# Patient Record
Sex: Female | Born: 1995 | Race: Black or African American | Hispanic: No | Marital: Single | Smoking: Never smoker
Health system: Southern US, Community
[De-identification: ages and names within clinical notes are randomized; demographics above are authoritative.]

## PROBLEM LIST (undated history)

## (undated) DIAGNOSIS — R569 Unspecified convulsions: Secondary | ICD-10-CM

## (undated) DIAGNOSIS — F445 Conversion disorder with seizures or convulsions: Secondary | ICD-10-CM

## (undated) DIAGNOSIS — Z9289 Personal history of other medical treatment: Secondary | ICD-10-CM

---
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
## 2016-02-16 ENCOUNTER — Encounter (HOSPITAL_COMMUNITY): Payer: Self-pay

## 2016-02-16 ENCOUNTER — Emergency Department (HOSPITAL_COMMUNITY)
Admission: EM | Admit: 2016-02-16 | Discharge: 2016-02-16 | Disposition: A | Discharge status: Home / Self Care | Payer: No Typology Code available for payment source | Attending: Emergency Medicine | Admitting: Emergency Medicine

## 2016-02-16 DIAGNOSIS — Z9114 Patient's other noncompliance with medication regimen: Secondary | ICD-10-CM | POA: Insufficient documentation

## 2016-02-16 DIAGNOSIS — R569 Unspecified convulsions: Secondary | ICD-10-CM | POA: Insufficient documentation

## 2016-02-16 HISTORY — DX: Unspecified convulsions: R56.9

## 2016-02-16 LAB — I-STAT CHEM 8, ED
BUN: 15 mg/dL (ref 6–20)
CALCIUM ION: 1.21 mmol/L (ref 1.15–1.40)
Chloride: 105 mmol/L (ref 101–111)
Creatinine, Ser: 0.8 mg/dL (ref 0.44–1.00)
GLUCOSE: 80 mg/dL (ref 65–99)
HCT: 30 % — ABNORMAL LOW (ref 36.0–46.0)
HEMOGLOBIN: 10.2 g/dL — AB (ref 12.0–15.0)
Potassium: 3.9 mmol/L (ref 3.5–5.1)
Sodium: 141 mmol/L (ref 135–145)
TCO2: 23 mmol/L (ref 0–100)

## 2016-02-16 LAB — I-STAT BETA HCG BLOOD, ED (MC, WL, AP ONLY)

## 2016-02-16 LAB — CBG MONITORING, ED: GLUCOSE-CAPILLARY: 89 mg/dL (ref 65–99)

## 2016-02-16 MED ORDER — SODIUM CHLORIDE 0.9 % IV BOLUS (SEPSIS)
1000.0000 mL | Freq: Once | INTRAVENOUS | Status: AC
  Administered 2016-02-16: 1000 mL via INTRAVENOUS

## 2016-02-16 NOTE — ED Provider Notes (Signed)
[facility] DEPT Provider Note   CSN: [id number] Arrival date & time: 02/16/16  1022     History   Chief Complaint Chief Complaint  Patient presents with  . Seizures    seizures, pt had multiple seizures en route has a hx of seizures     HPI Elizabeth Wilkinson is a 20 y.o. female.  The history is provided by the patient.  Seizures   This is a chronic problem. The current episode started 1 to 2 hours ago. The problem has been resolved. There were 6 to 10 seizures. The most recent episode lasted 30 to 120 seconds. Associated symptoms include confusion. Pertinent negatives include no sleepiness, no headaches, no visual disturbance, no neck stiffness, no sore throat, no chest pain, no cough, no nausea, no vomiting and no diarrhea. Characteristics include rhythmic jerking and loss of consciousness. The episode was witnessed. The seizures did not continue in the ED. There has been no fever. Meds prior to arrival: 2.5 Versed by EMS.   Patient does not remember what antiepileptic medicine she takes but does report being noncompliant. She reports she forgets sometimes. Last time she remembers taking a medicine was "maybe a week ago."  She denies any recent infectious symptoms. Denies any recent sexual relations and does not believe she is pregnant. Denies any recent trauma.   Past Medical History:  Diagnosis Date  . Seizure (HCC)     There are no active problems to display for this patient.   History reviewed. No pertinent surgical history.  OB History    No data available       Home Medications    Prior to Admission medications   Not on File    Family History No family history on file.  Social History Social History  Substance Use Topics  . Smoking status: Never Smoker  . Smokeless tobacco: Never Used  . Alcohol use No     Allergies   Review of patient's allergies indicates no known allergies.   Review of Systems Review of Systems  Constitutional: Negative  for chills and fever.  HENT: Negative for ear pain and sore throat.   Eyes: Negative for pain and visual disturbance.  Respiratory: Negative for cough and shortness of breath.   Cardiovascular: Negative for chest pain and palpitations.  Gastrointestinal: Negative for abdominal pain, diarrhea, nausea and vomiting.  Genitourinary: Negative for dysuria and hematuria.  Musculoskeletal: Negative for arthralgias and back pain.  Skin: Negative for color change and rash.  Neurological: Positive for seizures and loss of consciousness. Negative for syncope and headaches.  Psychiatric/Behavioral: Positive for confusion.  All other systems reviewed and are negative.    Physical Exam Updated Vital Signs BP 109/72 (BP Location: Left Arm)   Pulse 89   Temp 98.2 F (36.8 C) (Oral)   Resp 18   Ht 5\' 6"  (1.676 m)   Wt 127 lb (57.6 kg)   SpO2 100%   BMI 20.50 kg/m   Physical Exam  Constitutional: She is oriented to person, place, and time. She appears well-developed and well-nourished. No distress.  HENT:  Head: Normocephalic and atraumatic.  Nose: Nose normal.  Eyes: Conjunctivae and EOM are normal. Pupils are equal, round, and reactive to light. Right eye exhibits no discharge. Left eye exhibits no discharge. No scleral icterus.  Neck: Normal range of motion. Neck supple.  Cardiovascular: Normal rate and regular rhythm.  Exam reveals no gallop and no friction rub.   No murmur heard. Pulmonary/Chest: Effort normal and breath  sounds normal. No stridor. No respiratory distress. She has no rales.  Abdominal: Soft. She exhibits no distension. There is no tenderness.  Musculoskeletal: She exhibits no edema or tenderness.  Neurological: She is alert and oriented to person, place, and time.  Skin: Skin is warm and dry. No rash noted. She is not diaphoretic. No erythema.  Psychiatric: She has a normal mood and affect.  Vitals reviewed.    ED Treatments / Results  Labs (all labs ordered are  listed, but only abnormal results are displayed) Labs Reviewed  I-STAT CHEM 8, ED - Abnormal; Notable for the following:       Result Value   Hemoglobin 10.2 (*)    HCT 30.0 (*)    All other components within normal limits  CBG MONITORING, ED  I-STAT BETA HCG BLOOD, ED ([facility], [facility], [facility] ONLY)    EKG  EKG Interpretation None       Radiology No results found.  Procedures Procedures (including critical care time)  Medications Ordered in ED Medications  sodium chloride 0.9 % bolus 1,000 mL (1,000 mLs Intravenous New Bag/Given 02/16/16 1049)     Initial Impression / Assessment and Plan / ED Course  I have reviewed the triage vital signs and the nursing notes.  Pertinent labs & imaging results that were available during my care of the patient were reviewed by me and considered in my medical decision making (see chart for details).  Clinical Course    Patient with a history of seizures was been noncompliant with her medication and presented today with multiple seizure episodes requiring 2.5 mg of Versed by EMS. No recent triggers. We'll assess basic labs and check urine pregnancy test. On my assessment patient is awake alert oriented 3 and is mildly confused.  We'll contact the patient's family with her permission to determine what kind of antiepileptic patient is on.  Patient given IV fluids.  Upon review of records on care everywhere, we noted that the patient has a history of nonepileptic seizures who was placed on a trial of Keppra prior to obtaining an EEG that was nondiagnostic for epilepsy. Since then patient has been off Keppra. We spoke with mom as well who confirmed.  We'll follow up on screening labs and pregnancy test. If these are unremarkable the patient would be safe for discharge with PCP follow-up.  Labs without any electrolyte derangements. Beta hCG negative.  Safe for discharge with strict return precautions.  Final Clinical Impressions(s) / ED Diagnoses    Final diagnoses:  Seizure-like activity (HCC)   Disposition: Discharge  Condition: Good  I have discussed the results, Dx and Tx plan with the patient who expressed understanding and agree(s) with the plan. Discharge instructions discussed at great length. The patient was given strict return precautions who verbalized understanding of the instructions. No further questions at time of discharge.    New Prescriptions   No medications on file    Follow Up: Primary care provider  Schedule an appointment as soon as possible for a visit  As needed      Nira ConnPedro Eduardo Cardama, MD 02/16/16 1245

## 2016-02-16 NOTE — ED Notes (Signed)
MD at bedside. Pt able to recall events though pt does appear drowsy

## 2016-02-16 NOTE — ED Notes (Addendum)
Spoke with mother via phone, as per mother pt does not take any medications for seizures. Pt is currently speaking with mother and appears awake and alert IVF infusing pt's mother stated that they did a trial of seizure medications but that they did not do anything.
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
## 2017-02-13 ENCOUNTER — Emergency Department (HOSPITAL_COMMUNITY): Payer: Self-pay

## 2017-02-13 ENCOUNTER — Emergency Department (HOSPITAL_COMMUNITY)
Admission: EM | Admit: 2017-02-13 | Discharge: 2017-02-14 | Disposition: A | Discharge status: Home / Self Care | Payer: Self-pay | Attending: Emergency Medicine | Admitting: Emergency Medicine

## 2017-02-13 DIAGNOSIS — R079 Chest pain, unspecified: Secondary | ICD-10-CM | POA: Insufficient documentation

## 2017-02-13 DIAGNOSIS — R569 Unspecified convulsions: Secondary | ICD-10-CM | POA: Insufficient documentation

## 2017-02-13 DIAGNOSIS — M25561 Pain in right knee: Secondary | ICD-10-CM | POA: Insufficient documentation

## 2017-02-13 DIAGNOSIS — Z3490 Encounter for supervision of normal pregnancy, unspecified, unspecified trimester: Secondary | ICD-10-CM | POA: Insufficient documentation

## 2017-02-13 LAB — CBC WITH DIFFERENTIAL/PLATELET
Basophils Absolute: 0 10*3/uL (ref 0.0–0.1)
Basophils Relative: 0 %
EOS ABS: 0 10*3/uL (ref 0.0–0.7)
Eosinophils Relative: 1 %
HCT: 28.5 % — ABNORMAL LOW (ref 36.0–46.0)
Hemoglobin: 9.1 g/dL — ABNORMAL LOW (ref 12.0–15.0)
LYMPHS ABS: 2.7 10*3/uL (ref 0.7–4.0)
LYMPHS PCT: 32 %
MCH: 24.2 pg — AB (ref 26.0–34.0)
MCHC: 31.9 g/dL (ref 30.0–36.0)
MCV: 75.8 fL — AB (ref 78.0–100.0)
MONO ABS: 0.4 10*3/uL (ref 0.1–1.0)
Monocytes Relative: 5 %
Neutro Abs: 5.4 10*3/uL (ref 1.7–7.7)
Neutrophils Relative %: 63 %
Platelets: 255 10*3/uL (ref 150–400)
RBC: 3.76 MIL/uL — AB (ref 3.87–5.11)
RDW: 16 % — AB (ref 11.5–15.5)
WBC: 8.6 10*3/uL (ref 4.0–10.5)

## 2017-02-13 LAB — I-STAT BETA HCG BLOOD, ED (MC, WL, AP ONLY)

## 2017-02-13 LAB — COMPREHENSIVE METABOLIC PANEL
ALBUMIN: 3.7 g/dL (ref 3.5–5.0)
ALK PHOS: 48 U/L (ref 38–126)
ALT: 11 U/L — ABNORMAL LOW (ref 14–54)
ANION GAP: 9 (ref 5–15)
AST: 20 U/L (ref 15–41)
BILIRUBIN TOTAL: 0.4 mg/dL (ref 0.3–1.2)
BUN: 18 mg/dL (ref 6–20)
CALCIUM: 8.9 mg/dL (ref 8.9–10.3)
CO2: 21 mmol/L — ABNORMAL LOW (ref 22–32)
Chloride: 106 mmol/L (ref 101–111)
Creatinine, Ser: 0.78 mg/dL (ref 0.44–1.00)
GFR calc Af Amer: >60 mL/min (ref >60)
GLUCOSE: 89 mg/dL (ref 65–99)
POTASSIUM: 3.6 mmol/L (ref 3.5–5.1)
Sodium: 136 mmol/L (ref 135–145)
TOTAL PROTEIN: 6.5 g/dL (ref 6.5–8.1)

## 2017-02-13 LAB — I-STAT TROPONIN, ED: TROPONIN I, POC: 0 ng/mL (ref 0.00–0.08)

## 2017-02-13 LAB — ETHANOL: Alcohol, Ethyl (B): <10 mg/dL (ref <10)

## 2017-02-13 LAB — HCG, QUANTITATIVE, PREGNANCY: hCG, Beta Chain, Quant, S: 2413 m[IU]/mL — ABNORMAL HIGH (ref <5)

## 2017-02-13 MED ORDER — ACETAMINOPHEN 325 MG PO TABS
650.0000 mg | ORAL_TABLET | Freq: Once | ORAL | Status: AC
  Administered 2017-02-13: 650 mg via ORAL
  Filled 2017-02-13: qty 2

## 2017-02-13 MED ORDER — PRENATAL COMPLETE 14-0.4 MG PO TABS: 1.0000 | ORAL_TABLET | Freq: Every day | ORAL | 0 refills | Status: AC

## 2017-02-13 MED ORDER — SODIUM CHLORIDE 0.9 % IV BOLUS (SEPSIS)
1000.0000 mL | Freq: Once | INTRAVENOUS | Status: AC
  Administered 2017-02-13: 1000 mL via INTRAVENOUS

## 2017-02-13 MED ORDER — FENTANYL CITRATE (PF) 100 MCG/2ML IJ SOLN
25.0000 ug | Freq: Once | INTRAMUSCULAR | Status: DC
Start: 2017-02-13 — End: 2017-02-13
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
## 2017-02-13 NOTE — Discharge Instructions (Signed)
Take prenatal vitamins.   [facility]   Take tylenol for pain.   Stay hydrated.   Return to ER if you have vaginal bleeding, seizure, lethargy, chest pain, abdominal pain.
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
## 2017-02-13 NOTE — ED Notes (Signed)
Pt requesting for ED providers to ask family members to get out of the room prior to give any information about her care.

## 2017-02-14 ENCOUNTER — Emergency Department (HOSPITAL_COMMUNITY)
Admission: EM | Admit: 2017-02-14 | Discharge: 2017-02-15 | Disposition: A | Discharge status: Home / Self Care | Payer: Medicaid Other | Attending: Emergency Medicine | Admitting: Emergency Medicine

## 2017-02-14 ENCOUNTER — Encounter (HOSPITAL_COMMUNITY): Payer: Self-pay | Admitting: Emergency Medicine

## 2017-02-14 DIAGNOSIS — O200 Threatened abortion: Secondary | ICD-10-CM

## 2017-02-14 DIAGNOSIS — O4690 Antepartum hemorrhage, unspecified, unspecified trimester: Secondary | ICD-10-CM

## 2017-02-14 DIAGNOSIS — Z3A01 Less than 8 weeks gestation of pregnancy: Secondary | ICD-10-CM | POA: Insufficient documentation

## 2017-02-14 LAB — URINALYSIS, ROUTINE W REFLEX MICROSCOPIC
Bacteria, UA: NONE SEEN
Bilirubin Urine: NEGATIVE
Glucose, UA: NEGATIVE mg/dL
Ketones, ur: NEGATIVE mg/dL
LEUKOCYTES UA: NEGATIVE
NITRITE: NEGATIVE
Protein, ur: NEGATIVE mg/dL
SPECIFIC GRAVITY, URINE: 1.028 (ref 1.005–1.030)
pH: 5 (ref 5.0–8.0)

## 2017-02-14 LAB — CBC WITH DIFFERENTIAL/PLATELET
BASOS PCT: 0 %
Basophils Absolute: 0 10*3/uL (ref 0.0–0.1)
EOS ABS: 0.1 10*3/uL (ref 0.0–0.7)
EOS PCT: 1 %
HCT: 29.4 % — ABNORMAL LOW (ref 36.0–46.0)
Hemoglobin: 8.8 g/dL — ABNORMAL LOW (ref 12.0–15.0)
LYMPHS ABS: 2.7 10*3/uL (ref 0.7–4.0)
Lymphocytes Relative: 36 %
MCH: 23.3 pg — ABNORMAL LOW (ref 26.0–34.0)
MCHC: 29.9 g/dL — ABNORMAL LOW (ref 30.0–36.0)
MCV: 77.8 fL — ABNORMAL LOW (ref 78.0–100.0)
Monocytes Absolute: 0.3 10*3/uL (ref 0.1–1.0)
Monocytes Relative: 4 %
Neutro Abs: 4.5 10*3/uL (ref 1.7–7.7)
Neutrophils Relative %: 59 %
PLATELETS: 256 10*3/uL (ref 150–400)
RBC: 3.78 MIL/uL — AB (ref 3.87–5.11)
RDW: 15.8 % — ABNORMAL HIGH (ref 11.5–15.5)
WBC: 7.6 10*3/uL (ref 4.0–10.5)

## 2017-02-14 LAB — COMPREHENSIVE METABOLIC PANEL
ALT: 10 U/L — ABNORMAL LOW (ref 14–54)
ANION GAP: 5 (ref 5–15)
AST: 18 U/L (ref 15–41)
Albumin: 3.7 g/dL (ref 3.5–5.0)
Alkaline Phosphatase: 45 U/L (ref 38–126)
BILIRUBIN TOTAL: 0.4 mg/dL (ref 0.3–1.2)
BUN: 12 mg/dL (ref 6–20)
CO2: 24 mmol/L (ref 22–32)
Calcium: 9 mg/dL (ref 8.9–10.3)
Chloride: 108 mmol/L (ref 101–111)
Creatinine, Ser: 0.87 mg/dL (ref 0.44–1.00)
GFR calc Af Amer: >60 mL/min (ref >60)
Glucose, Bld: 122 mg/dL — ABNORMAL HIGH (ref 65–99)
POTASSIUM: 3.5 mmol/L (ref 3.5–5.1)
Sodium: 137 mmol/L (ref 135–145)
TOTAL PROTEIN: 6.4 g/dL — AB (ref 6.5–8.1)

## 2017-02-14 LAB — HCG, QUANTITATIVE, PREGNANCY: HCG, BETA CHAIN, QUANT, S: 2769 m[IU]/mL — AB (ref <5)
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
## 2017-02-15 LAB — WET PREP, GENITAL
CLUE CELLS WET PREP: NONE SEEN
Sperm: NONE SEEN
TRICH WET PREP: NONE SEEN
WBC, Wet Prep HPF POC: NONE SEEN
Yeast Wet Prep HPF POC: NONE SEEN

## 2017-02-15 LAB — GC/CHLAMYDIA PROBE AMP (~~LOC~~) NOT AT ARMC
Chlamydia: NEGATIVE
NEISSERIA GONORRHEA: NEGATIVE

## 2017-02-15 MED ORDER — ACETAMINOPHEN 500 MG PO TABS
1000.0000 mg | ORAL_TABLET | Freq: Once | ORAL | Status: AC
  Administered 2017-02-15: 1000 mg via ORAL
  Filled 2017-02-15: qty 2

## 2017-02-15 NOTE — ED Notes (Signed)
Pelvic completed spec to lab
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
## 2017-02-15 NOTE — Discharge Instructions (Signed)
You may take Tylenol 1000 mg every 6 hours as needed for pain. Ibuprofen, aspirin, Aleve, Goody powders are not safe in pregnancy. I recommend close follow-up with an OB/GYN to have your hCG rechecked in 48 hours.  If you begin to have worsening pain, fever of 100.4 higher, increased vaginal bleeding where your soaking through more than 1 pad an hour for more than 2 straight hours, feel like he may pass out, please return to the hospital.    [email or web address] [address] [place], [place] [phone]    [email or web address] [address] [place], [place] [phone]    [facility] [address] [place], [place] [phone]   [email or web address] [address] [place], [place] [phone]   [facility] [email or web address] [address] [place], [place] [phone]   [email or web address] [address] [place], [place] [phone]

## 2017-02-20 ENCOUNTER — Encounter: Payer: Self-pay | Admitting: Family Medicine

## 2017-02-20 ENCOUNTER — Ambulatory Visit: Payer: Self-pay | Admitting: *Deleted

## 2017-02-20 DIAGNOSIS — O209 Hemorrhage in early pregnancy, unspecified: Secondary | ICD-10-CM

## 2017-02-20 DIAGNOSIS — Z3490 Encounter for supervision of normal pregnancy, unspecified, unspecified trimester: Secondary | ICD-10-CM

## 2017-02-20 NOTE — Progress Notes (Signed)
Elizabeth Wilkinson here for stat bhcg but upon review did have an ultrasound confirming intrauterine pregancy on 02/13/17.  Has had bleeding like a period since 02/14/17 and pain got worse while in [place] on 9/281/8 and went to ed then.  Was told is having a miscarriage. States bleeding like a period until 02/17/17 then light bleeding until today spotting. Reviewed with Dr. Shawnie Pons and ordered US in 2 weeks from last ultrasound. Explained to patient plan of care and to go to [facility]au for severe pain or heavy bleeding. She voices understanding.

## 2017-02-20 NOTE — Progress Notes (Signed)
Patient seen and assessed by nursing staff.  Agree with documentation and plan.  

## 2017-02-27 ENCOUNTER — Ambulatory Visit: Payer: Self-pay | Admitting: General Practice

## 2017-02-27 ENCOUNTER — Ambulatory Visit (HOSPITAL_COMMUNITY)
Admission: RE | Admit: 2017-02-27 | Discharge: 2017-02-27 | Disposition: A | Discharge status: Home / Self Care | Payer: Medicaid Other | Source: Ambulatory Visit | Attending: Family Medicine | Admitting: Family Medicine

## 2017-02-27 DIAGNOSIS — Z3490 Encounter for supervision of normal pregnancy, unspecified, unspecified trimester: Secondary | ICD-10-CM

## 2017-02-27 DIAGNOSIS — O209 Hemorrhage in early pregnancy, unspecified: Secondary | ICD-10-CM

## 2017-02-27 DIAGNOSIS — O039 Complete or unspecified spontaneous abortion without complication: Secondary | ICD-10-CM

## 2017-02-27 NOTE — Addendum Note (Signed)
Addended by: Kathee Delton on: 02/27/2017 04:36 PM   Modules accepted: Level of Service

## 2017-02-27 NOTE — Progress Notes (Signed)
Agree with nursing staff's documentation of this patient's clinic encounter.  Karita Dralle, MD    

## 2017-02-27 NOTE — Progress Notes (Signed)
Patient here for ultrasound results today. Reviewed ultrasound/history with Dr Macon Large who states ultrasound consistent with failed pregnancy but should repeat bhcg today and follow to 0. Informed patient of results & plan of care. Patient verbalized understanding and had no questions

## 2017-02-28 LAB — BETA HCG QUANT (REF LAB): HCG QUANT: 10 m[IU]/mL

## 2017-05-11 ENCOUNTER — Other Ambulatory Visit: Payer: Self-pay

## 2017-05-11 ENCOUNTER — Emergency Department (HOSPITAL_COMMUNITY)
Admission: EM | Admit: 2017-05-11 | Discharge: 2017-05-11 | Disposition: A | Discharge status: Home / Self Care | Payer: Medicaid Other | Attending: Emergency Medicine | Admitting: Emergency Medicine

## 2017-05-11 ENCOUNTER — Encounter (HOSPITAL_COMMUNITY): Payer: Self-pay

## 2017-05-11 DIAGNOSIS — F445 Conversion disorder with seizures or convulsions: Secondary | ICD-10-CM | POA: Insufficient documentation

## 2017-05-11 DIAGNOSIS — R4020 Unspecified coma: Secondary | ICD-10-CM

## 2017-05-11 DIAGNOSIS — R569 Unspecified convulsions: Secondary | ICD-10-CM

## 2017-05-11 DIAGNOSIS — R55 Syncope and collapse: Secondary | ICD-10-CM | POA: Insufficient documentation

## 2017-05-11 HISTORY — DX: Conversion disorder with seizures or convulsions: F44.5

## 2017-05-11 HISTORY — DX: Personal history of other medical treatment: Z92.89

## 2017-05-11 LAB — CBC WITH DIFFERENTIAL/PLATELET
BASOS ABS: 0 10*3/uL (ref 0.0–0.1)
BASOS PCT: 0 %
EOS ABS: 0 10*3/uL (ref 0.0–0.7)
EOS PCT: 0 %
HCT: 28.4 % — ABNORMAL LOW (ref 36.0–46.0)
HEMOGLOBIN: 8.9 g/dL — AB (ref 12.0–15.0)
Lymphocytes Relative: 26 %
Lymphs Abs: 2 10*3/uL (ref 0.7–4.0)
MCH: 24.2 pg — ABNORMAL LOW (ref 26.0–34.0)
MCHC: 31.3 g/dL (ref 30.0–36.0)
MCV: 77.2 fL — ABNORMAL LOW (ref 78.0–100.0)
Monocytes Absolute: 0.3 10*3/uL (ref 0.1–1.0)
Monocytes Relative: 4 %
NEUTROS PCT: 70 %
Neutro Abs: 5.3 10*3/uL (ref 1.7–7.7)
PLATELETS: 244 10*3/uL (ref 150–400)
RBC: 3.68 MIL/uL — AB (ref 3.87–5.11)
RDW: 17.5 % — ABNORMAL HIGH (ref 11.5–15.5)
WBC: 7.6 10*3/uL (ref 4.0–10.5)

## 2017-05-11 LAB — I-STAT BETA HCG BLOOD, ED (MC, WL, AP ONLY)

## 2017-05-11 LAB — COMPREHENSIVE METABOLIC PANEL
ALT: 9 U/L — AB (ref 14–54)
AST: 20 U/L (ref 15–41)
Albumin: 3.7 g/dL (ref 3.5–5.0)
Alkaline Phosphatase: 47 U/L (ref 38–126)
Anion gap: 6 (ref 5–15)
BILIRUBIN TOTAL: 0.6 mg/dL (ref 0.3–1.2)
BUN: 16 mg/dL (ref 6–20)
CO2: 26 mmol/L (ref 22–32)
CREATININE: 0.79 mg/dL (ref 0.44–1.00)
Calcium: 8.9 mg/dL (ref 8.9–10.3)
Chloride: 105 mmol/L (ref 101–111)
Glucose, Bld: 129 mg/dL — ABNORMAL HIGH (ref 65–99)
Potassium: 3.4 mmol/L — ABNORMAL LOW (ref 3.5–5.1)
Sodium: 137 mmol/L (ref 135–145)
TOTAL PROTEIN: 6.7 g/dL (ref 6.5–8.1)

## 2017-05-11 LAB — CBG MONITORING, ED: Glucose-Capillary: 129 mg/dL — ABNORMAL HIGH (ref 65–99)

## 2017-05-11 MED ORDER — SODIUM CHLORIDE 0.9 % IV BOLUS (SEPSIS)
1000.0000 mL | Freq: Once | INTRAVENOUS | Status: AC
  Administered 2017-05-11: 1000 mL via INTRAVENOUS

## 2017-05-11 MED ORDER — POTASSIUM CHLORIDE CRYS ER 20 MEQ PO TBCR
40.0000 meq | EXTENDED_RELEASE_TABLET | Freq: Once | ORAL | Status: AC
  Administered 2017-05-11: 40 meq via ORAL
  Filled 2017-05-11: qty 2

## 2017-05-11 NOTE — ED Provider Notes (Signed)
[facility]-EMERGENCY DEPT Provider Note   CSN: [id number] Arrival date & time: 05/11/17  1246     History   Chief Complaint No chief complaint on file.   HPI Elizabeth Wilkinson is a 21 y.o. female who presents with loss of consciousness. PMH significant for pseudoseizures and anemia. She states she was at work - she works a Production assistant, radioserver at [employer] - when she suddenly passed out and had a "seizure". This was witnessed by coworkers who reported she had rhythmic jerking. The episode lasted 10-30 seconds. She does not recall the events surrounding the loss of consciousness. She denies any pain currently or injuries. She denies any fever, chills. She does have chest pain occassionally but not today. She had a cold last week but this is better. She has previously taken Keppra but was taken off of this since her seizures were non-epileptic. She states she is not currently pregnant and had a miscarriage a couple months ago.  HPI  Past Medical History:  Diagnosis Date  . History of electroencephalogram 2014   negative  . Psychogenic nonepileptic seizure 2014   [facility] (admission with video EEG)  . Seizure (HCC)     There are no active problems to display for this patient.   History reviewed. No pertinent surgical history.  OB History    Gravida Para Term Preterm AB Living   1             SAB TAB Ectopic Multiple Live Births                   Home Medications    Prior to Admission medications   Medication Sig Start Date End Date Taking? Authorizing Provider  albuterol (PROVENTIL HFA;VENTOLIN HFA) 108 (90 Base) MCG/ACT inhaler Inhale 1-2 puffs into the lungs every 6 (six) hours as needed for wheezing or shortness of breath.    [provider]  Prenatal Vit-Fe Fumarate-FA (PRENATAL COMPLETE) 14-0.4 MG TABS Take 1 tablet by mouth daily. Patient not taking: Reported on 02/14/2017 02/13/17   Charlynne PanderYao, David Hsienta, MD    Family History No family history  on file.  Social History Social History   Tobacco Use  . Smoking status: Never Smoker  . Smokeless tobacco: Never Used  Substance Use Topics  . Alcohol use: No  . Drug use: No     Allergies   Patient has no known allergies.   Review of Systems Review of Systems  Constitutional: Negative for chills and fever.  Respiratory: Negative for shortness of breath.   Cardiovascular: Positive for chest pain.  Gastrointestinal: Negative for abdominal pain, nausea and vomiting.  Genitourinary: Negative for dysuria.  Skin: Negative for wound.  Neurological: Positive for seizures and syncope. Negative for weakness, light-headedness and headaches.  Psychiatric/Behavioral: The patient is nervous/anxious.   All other systems reviewed and are negative.    Physical Exam Updated Vital Signs BP 110/64 (BP Location: Right Arm)   Pulse 91   Temp 98.6 F (37 C) (Oral)   Resp 18   Physical Exam  Constitutional: She is oriented to person, place, and time. She appears well-developed and well-nourished. No distress.  Alert and oriented, in NAD  HENT:  Head: Normocephalic and atraumatic.  Eyes: Conjunctivae are normal. Pupils are equal, round, and reactive to light. Right eye exhibits no discharge. Left eye exhibits no discharge. No scleral icterus.  Neck: Normal range of motion.  Cardiovascular: Normal rate and regular rhythm. Exam reveals no gallop and  no friction rub.  No murmur heard. Pulmonary/Chest: Effort normal and breath sounds normal. No stridor. No respiratory distress. She has no wheezes. She has no rales. She exhibits no tenderness.  Abdominal: Soft. Bowel sounds are normal. She exhibits no distension. There is no tenderness.  Neurological: She is alert and oriented to person, place, and time.  Skin: Skin is warm and dry.  Psychiatric: She has a normal mood and affect. Her behavior is normal.  Nursing note and vitals reviewed.    ED Treatments / Results  Labs (all labs  ordered are listed, but only abnormal results are displayed) Labs Reviewed  COMPREHENSIVE METABOLIC PANEL - Abnormal; Notable for the following components:      Result Value   Potassium 3.4 (*)    Glucose, Bld 129 (*)    ALT 9 (*)    All other components within normal limits  CBC WITH DIFFERENTIAL/PLATELET - Abnormal; Notable for the following components:   RBC 3.68 (*)    Hemoglobin 8.9 (*)    HCT 28.4 (*)    MCV 77.2 (*)    MCH 24.2 (*)    RDW 17.5 (*)    All other components within normal limits  CBG MONITORING, ED - Abnormal; Notable for the following components:   Glucose-Capillary 129 (*)    All other components within normal limits  I-STAT BETA HCG BLOOD, ED ([facility], [facility], [facility] ONLY)    EKG  EKG Interpretation None       Radiology No results found.  Procedures Procedures (including critical care time)  Medications Ordered in ED Medications  sodium chloride 0.9 % bolus 1,000 mL (0 mLs Intravenous Stopped 05/11/17 1635)  potassium chloride SA (K-DUR,KLOR-CON) CR tablet 40 mEq (40 mEq Oral Given 05/11/17 1634)     Initial Impression / Assessment and Plan / ED Course  I have reviewed the triage vital signs and the nursing notes.  Pertinent labs & imaging results that were available during my care of the patient were reviewed by me and considered in my medical decision making (see chart for details).  21 year old with loss of consciousness. Vitals are normal. Exam is unremarkable. CBC is remarkable for hgb of 8.9 which is stable for prior values. She does endorse being on iron supplementation. CMP shows mild hypokalemia. She was given fluids and potassium. EKG is NSR. She has had an extensive work up for this problem in the past including head CT and EEG which were both normal. Will d/c and advised to establish care with a PCP.  Final Clinical Impressions(s) / ED Diagnoses   Final diagnoses:  Loss of consciousness [facility](HCC)  Pseudoseizures    ED Discharge Orders    None        Bethel BornGekas, Bryan Goin Marie, PA-C 05/11/17 1801    Rolland PorterJames, Mark, MD 05/12/17 [phone]

## 2017-05-11 NOTE — ED Notes (Signed)
Bed: WA21 Expected date: 05/11/17 Expected time: 12:35 PM Means of arrival: Ambulance Comments: Seizures

## 2017-05-11 NOTE — ED Notes (Addendum)
Pt has ambulated in hallway to restroom at this time. Pt was shaky and walked with 2 person assistance to and from restroom.

## 2017-05-11 NOTE — ED Triage Notes (Signed)
Patient coming in by ems with c/o seizures. Pt had witness seizures and per witness pt had about 15-20 seizures that last 10-30 second. Pt have hx seizures. Pt state she stop taking her medication since high school.

## 2017-07-31 ENCOUNTER — Encounter (HOSPITAL_COMMUNITY): Payer: Self-pay | Admitting: *Deleted

## 2017-07-31 ENCOUNTER — Other Ambulatory Visit: Payer: Self-pay

## 2017-07-31 ENCOUNTER — Emergency Department (HOSPITAL_COMMUNITY): Payer: Medicaid Other

## 2017-07-31 ENCOUNTER — Emergency Department (HOSPITAL_COMMUNITY)
Admission: EM | Admit: 2017-07-31 | Discharge: 2017-07-31 | Disposition: A | Discharge status: Home / Self Care | Payer: Medicaid Other | Attending: Emergency Medicine | Admitting: Emergency Medicine

## 2017-07-31 DIAGNOSIS — G43109 Migraine with aura, not intractable, without status migrainosus: Secondary | ICD-10-CM

## 2017-07-31 LAB — CBC
HCT: 30.4 % — ABNORMAL LOW (ref 36.0–46.0)
Hemoglobin: 9.1 g/dL — ABNORMAL LOW (ref 12.0–15.0)
MCH: 23 pg — AB (ref 26.0–34.0)
MCHC: 29.9 g/dL — ABNORMAL LOW (ref 30.0–36.0)
MCV: 76.8 fL — ABNORMAL LOW (ref 78.0–100.0)
PLATELETS: 253 10*3/uL (ref 150–400)
RBC: 3.96 MIL/uL (ref 3.87–5.11)
RDW: 17.1 % — AB (ref 11.5–15.5)
WBC: 5.6 10*3/uL (ref 4.0–10.5)

## 2017-07-31 LAB — BASIC METABOLIC PANEL
ANION GAP: 9 (ref 5–15)
BUN: 14 mg/dL (ref 6–20)
CO2: 23 mmol/L (ref 22–32)
Calcium: 8.9 mg/dL (ref 8.9–10.3)
Chloride: 104 mmol/L (ref 101–111)
Creatinine, Ser: 0.82 mg/dL (ref 0.44–1.00)
GFR calc Af Amer: >60 mL/min (ref >60)
GLUCOSE: 88 mg/dL (ref 65–99)
POTASSIUM: 3.7 mmol/L (ref 3.5–5.1)
Sodium: 136 mmol/L (ref 135–145)

## 2017-07-31 LAB — I-STAT BETA HCG BLOOD, ED (MC, WL, AP ONLY): I-stat hCG, quantitative: <5 m[IU]/mL (ref <5)

## 2017-07-31 LAB — I-STAT TROPONIN, ED: TROPONIN I, POC: 0 ng/mL (ref 0.00–0.08)

## 2017-07-31 MED ORDER — DIPHENHYDRAMINE HCL 50 MG/ML IJ SOLN: 25.0000 mg | Freq: Once | INTRAMUSCULAR | Status: DC

## 2017-07-31 MED ORDER — KETOROLAC TROMETHAMINE 30 MG/ML IJ SOLN
30.0000 mg | Freq: Once | INTRAMUSCULAR | Status: AC
  Administered 2017-07-31: 30 mg via INTRAVENOUS
  Filled 2017-07-31: qty 1

## 2017-07-31 MED ORDER — SODIUM CHLORIDE 0.9 % IV BOLUS (SEPSIS)
1000.0000 mL | Freq: Once | INTRAVENOUS | Status: AC
  Administered 2017-07-31: 1000 mL via INTRAVENOUS

## 2017-07-31 MED ORDER — PROCHLORPERAZINE EDISYLATE 5 MG/ML IJ SOLN: 10.0000 mg | Freq: Once | INTRAMUSCULAR | Status: DC

## 2017-07-31 NOTE — Discharge Instructions (Signed)
Return here as needed. Follow up with your doctor. °

## 2017-07-31 NOTE — ED Triage Notes (Signed)
Pt reports having a migraine x 2 days, hx of same. Has sensitivity to light, denies n/v. Also reports mid chest pressure that radiates into her back.

## 2017-07-31 NOTE — ED Notes (Signed)
Patient verbalizes understanding of discharge instructions. Opportunity for questioning and answers were provided. Armband removed by staff, pt discharged from ED. E signature not working at this time.  

## 2017-08-04 NOTE — ED Provider Notes (Signed)
[facility] EMERGENCY DEPARTMENT Provider Note   CSN: [id number] Arrival date & time: 07/31/17  1148     History   Chief Complaint Chief Complaint  Patient presents with  . Migraine  . Chest Pain    HPI Elizabeth Wilkinson is a 22 y.o. female.  HPI Patient presents to the emergency department with migraine headache for the last 2 days.  The patient does have light sensitivity.  She states she does not have any nausea or vomiting.  The patient states that she did take some ibuprofen without relief of her symptoms.  The patient denies chest pain, shortness of breath, ,blurred vision, neck pain, fever, cough, weakness, numbness, dizziness, anorexia, edema, abdominal pain, nausea, vomiting, diarrhea, rash, back pain, dysuria, hematemesis, bloody stool, near syncope, or syncope. Past Medical History:  Diagnosis Date  . History of electroencephalogram 2014   negative  . Psychogenic nonepileptic seizure 2014   [facility] (admission with video EEG)  . Seizure (HCC)     There are no active problems to display for this patient.   History reviewed. No pertinent surgical history.  OB History    Gravida Para Term Preterm AB Living   1             SAB TAB Ectopic Multiple Live Births                   Home Medications    Prior to Admission medications   Medication Sig Start Date End Date Taking? Authorizing Provider  ibuprofen (ADVIL,MOTRIN) 200 MG tablet Take 600 mg by mouth every 6 (six) hours as needed for moderate pain.   Yes [provider]  Prenatal Vit-Fe Fumarate-FA (PRENATAL COMPLETE) 14-0.4 MG TABS Take 1 tablet by mouth daily. Patient not taking: Reported on 02/14/2017 02/13/17   Charlynne Pander, MD    Family History History reviewed. No pertinent family history.  Social History Social History   Tobacco Use  . Smoking status: Never Smoker  . Smokeless tobacco: Never Used  Substance Use Topics  . Alcohol use: No  . Drug use: No      Allergies   Tomato and Peanut oil   Review of Systems Review of Systems All other systems negative except as documented in the HPI. All pertinent positives and negatives as reviewed in the HPI.  Physical Exam Updated Vital Signs BP 108/62 (BP Location: Right Arm)   Pulse 62   Temp 98.3 F (36.8 C) (Oral)   Resp 18   LMP 07/29/2017   SpO2 100%   Breastfeeding? Unknown   Physical Exam  Constitutional: She is oriented to person, place, and time. She appears well-developed and well-nourished. No distress.  HENT:  Head: Normocephalic and atraumatic.  Mouth/Throat: Oropharynx is clear and moist.  Eyes: Pupils are equal, round, and reactive to light.  Neck: Normal range of motion. Neck supple.  Cardiovascular: Normal rate, regular rhythm and normal heart sounds. Exam reveals no gallop and no friction rub.  No murmur heard. Pulmonary/Chest: Effort normal and breath sounds normal. No respiratory distress. She has no wheezes.  Abdominal: Soft. Bowel sounds are normal. She exhibits no distension. There is no tenderness.  Neurological: She is alert and oriented to person, place, and time. She exhibits normal muscle tone. Coordination normal.  Skin: Skin is warm and dry. Capillary refill takes less than 2 seconds. No rash noted. No erythema.  Psychiatric: She has a normal mood and affect. Her behavior is normal.  Nursing  note and vitals reviewed.    ED Treatments / Results  Labs (all labs ordered are listed, but only abnormal results are displayed) Labs Reviewed  CBC - Abnormal; Notable for the following components:      Result Value   Hemoglobin 9.1 (*)    HCT 30.4 (*)    MCV 76.8 (*)    MCH 23.0 (*)    MCHC 29.9 (*)    RDW 17.1 (*)    All other components within normal limits  BASIC METABOLIC PANEL  I-STAT TROPONIN, ED  I-STAT BETA HCG BLOOD, ED ([facility], [facility], [facility] ONLY)    EKG  EKG Interpretation  Date/Time:  Wednesday July 31 2017 12:13:37 EDT Ventricular Rate:   73 PR Interval:  170 QRS Duration: 74 QT Interval:  372 QTC Calculation: 409 R Axis:   75 Text Interpretation:  Normal sinus rhythm Normal ECG Confirmed by Gwyneth SproutPlunkett, Whitney ([id number]) on 08/01/2017 9:20:26 PM       Radiology No results found.  Procedures Procedures (including critical care time)  Medications Ordered in ED Medications  sodium chloride 0.9 % bolus 1,000 mL (0 mLs Intravenous Stopped 07/31/17 1834)  ketorolac (TORADOL) 30 MG/ML injection 30 mg (30 mg Intravenous Given 07/31/17 1835)     Initial Impression / Assessment and Plan / ED Course  I have reviewed the triage vital signs and the nursing notes.  Pertinent labs & imaging results that were available during my care of the patient were reviewed by me and considered in my medical decision making (see chart for details).     Patient's migraine headache has resolved.  I have advised her to return here for any worsening in her condition.  The patient agrees the plan and all questions were answered.  Patient was given IV fluids along with Toradol.  Final Clinical Impressions(s) / ED Diagnoses   Final diagnoses:  Migraine with aura and without status migrainosus, not intractable    ED Discharge Orders    None       Charlestine NightLawyer, Willona Phariss, PA-C 08/04/17 1643    Mancel BaleWentz, Elliott, MD 08/07/17 [phone]

## 2018-11-12 IMAGING — US US OB TRANSVAGINAL
1 series · 15 of 20 positions shown · non-contrast
Comparison: 02/13/2017

CLINICAL DATA: Bleeding in pregnancy

EXAM:
TRANSVAGINAL OB ULTRASOUND
TECHNIQUE: Transvaginal ultrasound was performed for complete evaluation of the
gestation as well as the maternal uterus, adnexal regions, and
pelvic cul-de-sac.

[Series 1: us ob transvaginal · 20 acquisitions, 15 frames shown]
[im 1/20]
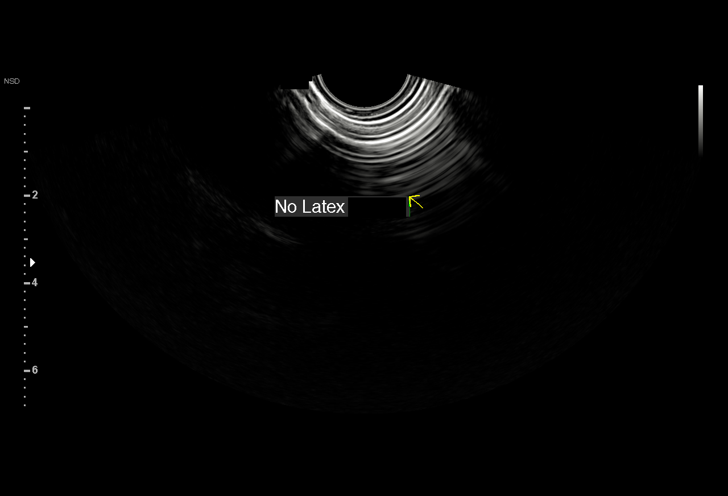
[im 3/20]
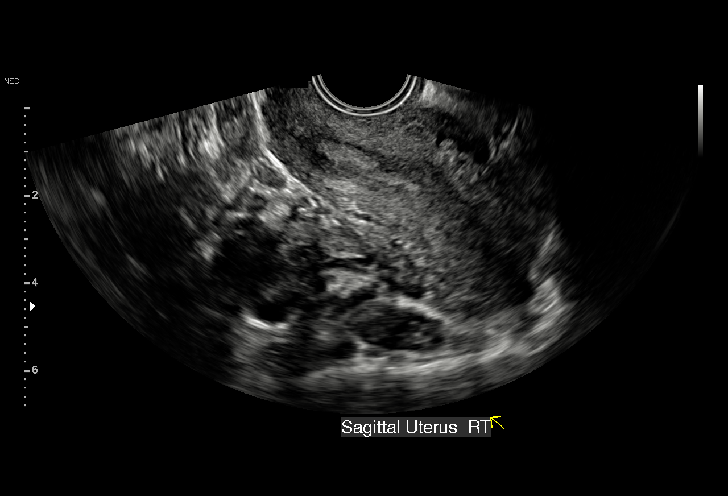
[im 4/20]
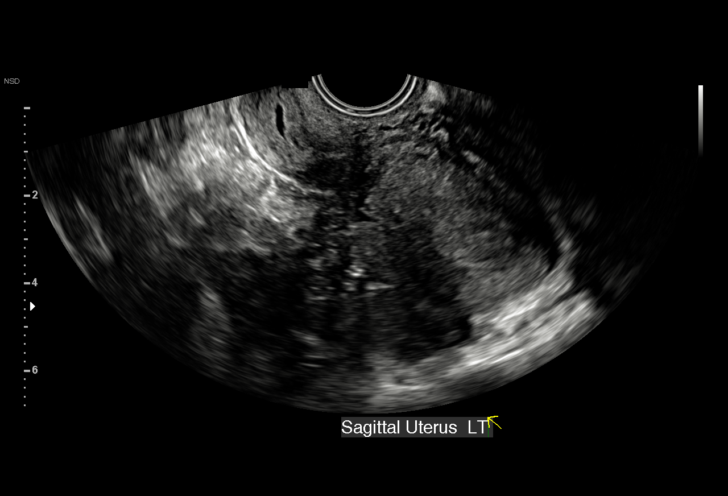
[im 5/20]
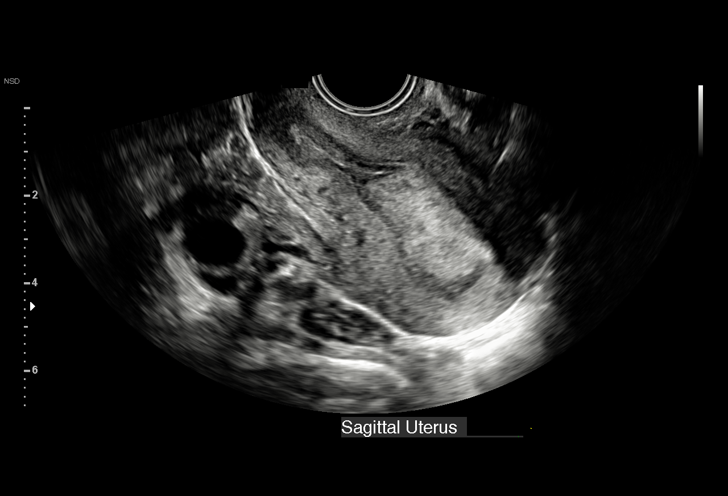
[im 7/20]
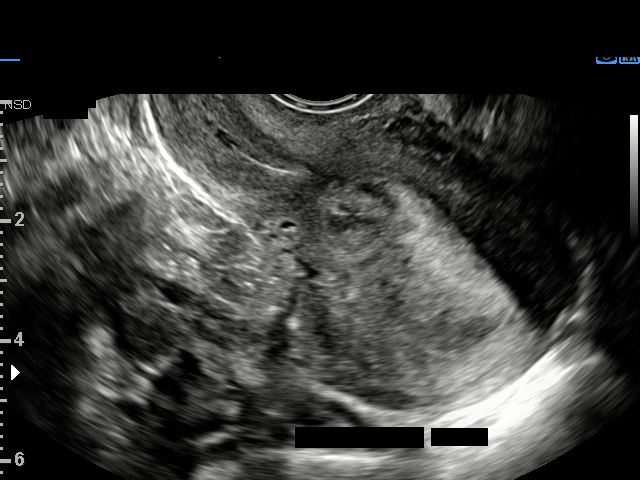
[im 8/20]
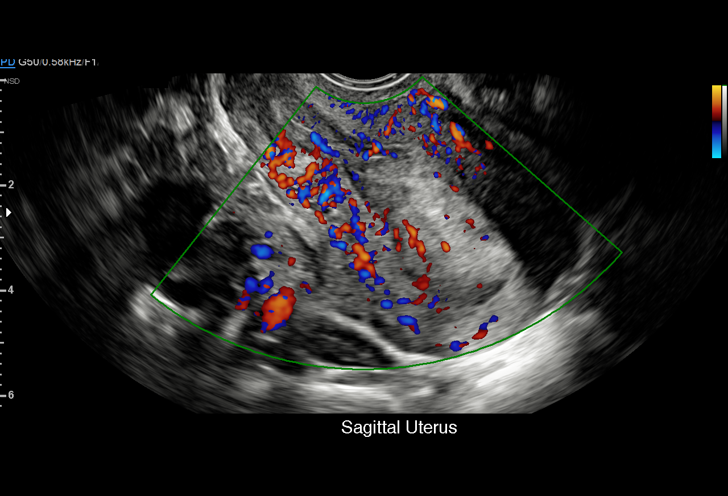
[im 9/20]
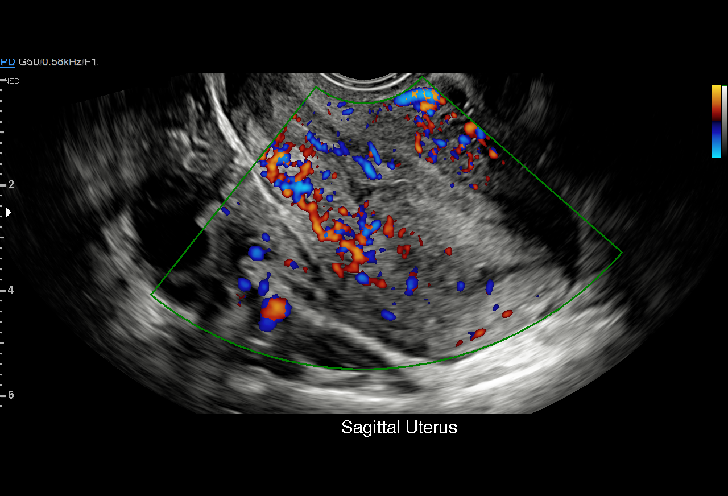
[im 11/20]
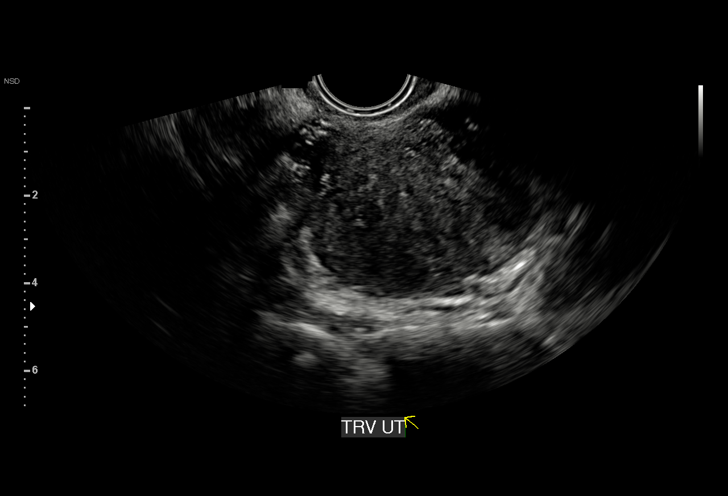
[im 12/20]
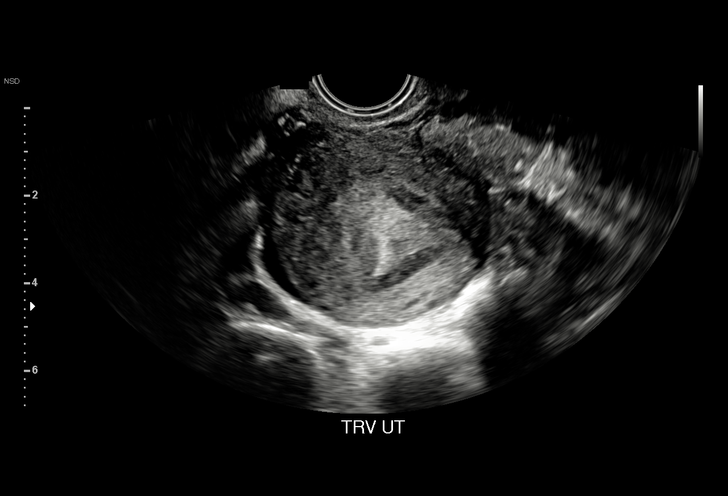
[im 13/20]
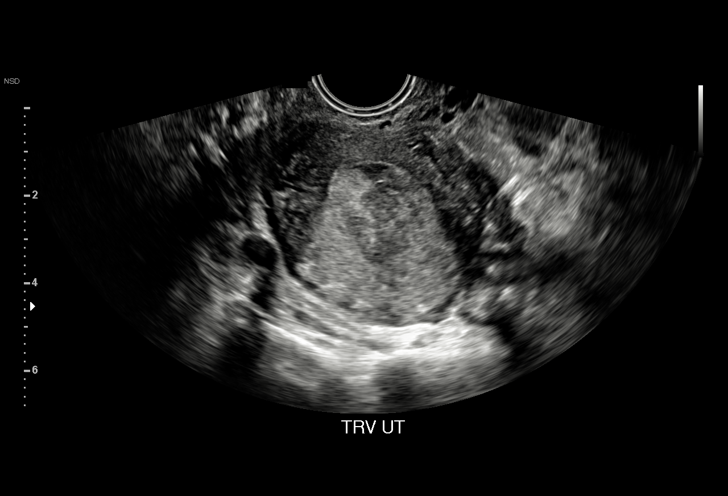
[im 15/20]
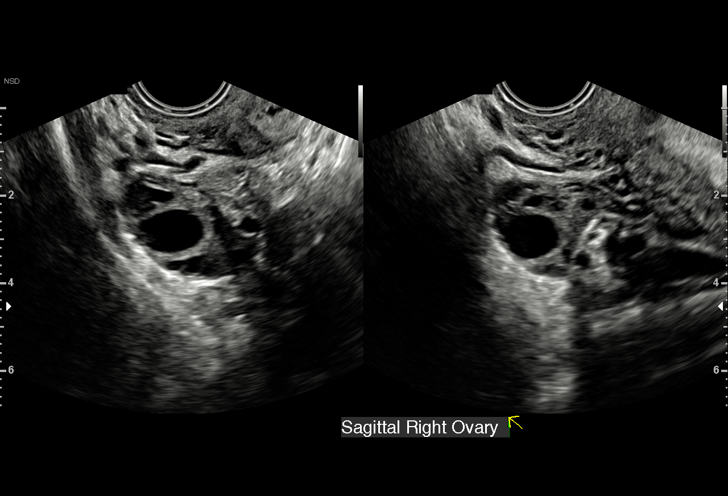
[im 16/20]
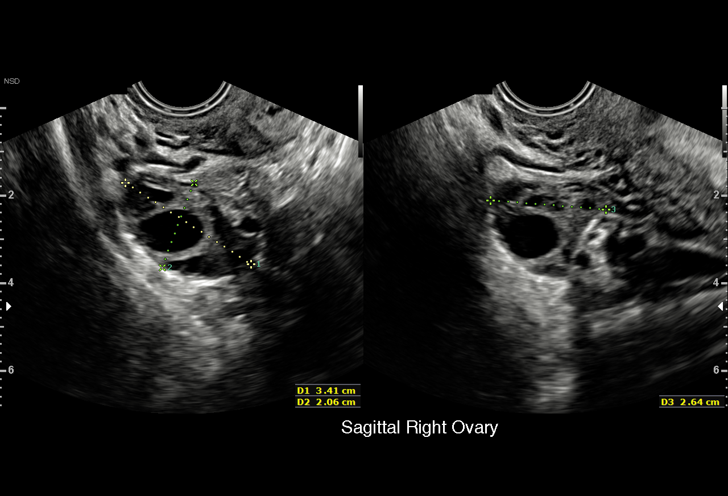
[im 17/20]
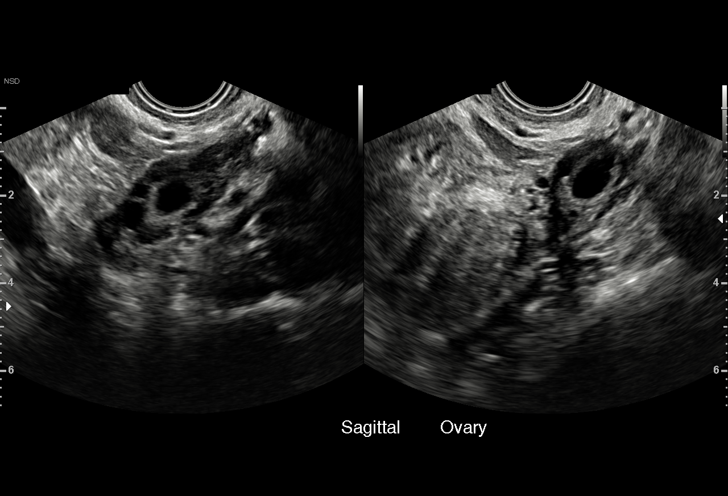
[im 19/20]
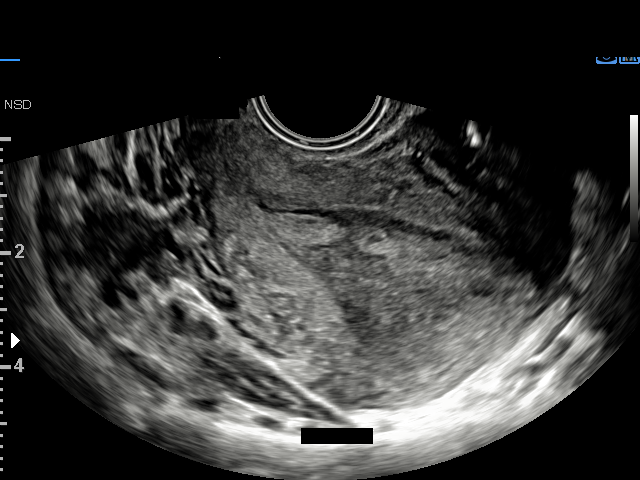
[im 20/20]
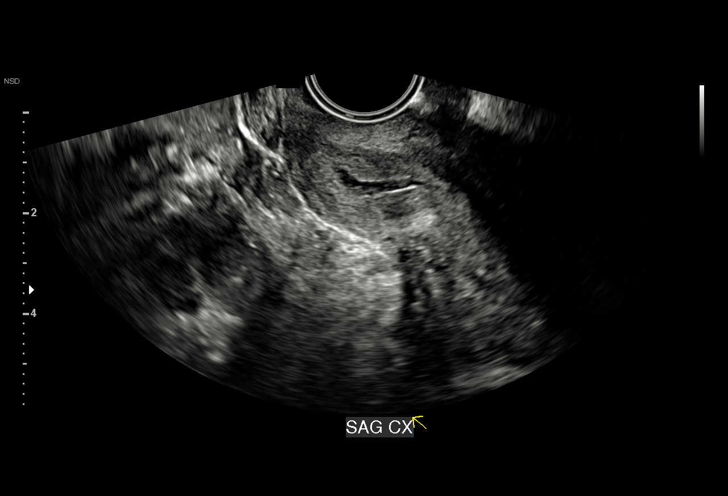

[15 of 20 positions shown; findings below may reference images not displayed]

FINDINGS: Intrauterine gestational sac: None visualized

Yolk sac:  Not visualized

Embryo:  Not visualized

Cardiac Activity: Not visualized

Heart Rate:  bpm

MSD:   mm    w     d

CRL:     mm    w  d                  US EDC:

Subchorionic hemorrhage:  N/A

Maternal uterus/adnexae: Endometrium is thickened, measuring 20 mm,
possibly containing blood products. No adnexal mass or free fluid.
IMPRESSION: No intrauterine gestation visualized. Thickened endometrium with
possible blood products within the endometrial canal.

## 2019-04-15 IMAGING — DX DG CHEST 2V
2 series · 2 of 2 positions shown · non-contrast
Comparison: None.

CLINICAL DATA: Chest pain for a week.  Worse today.

EXAM:
CHEST - 2 VIEW

[w chest pa]
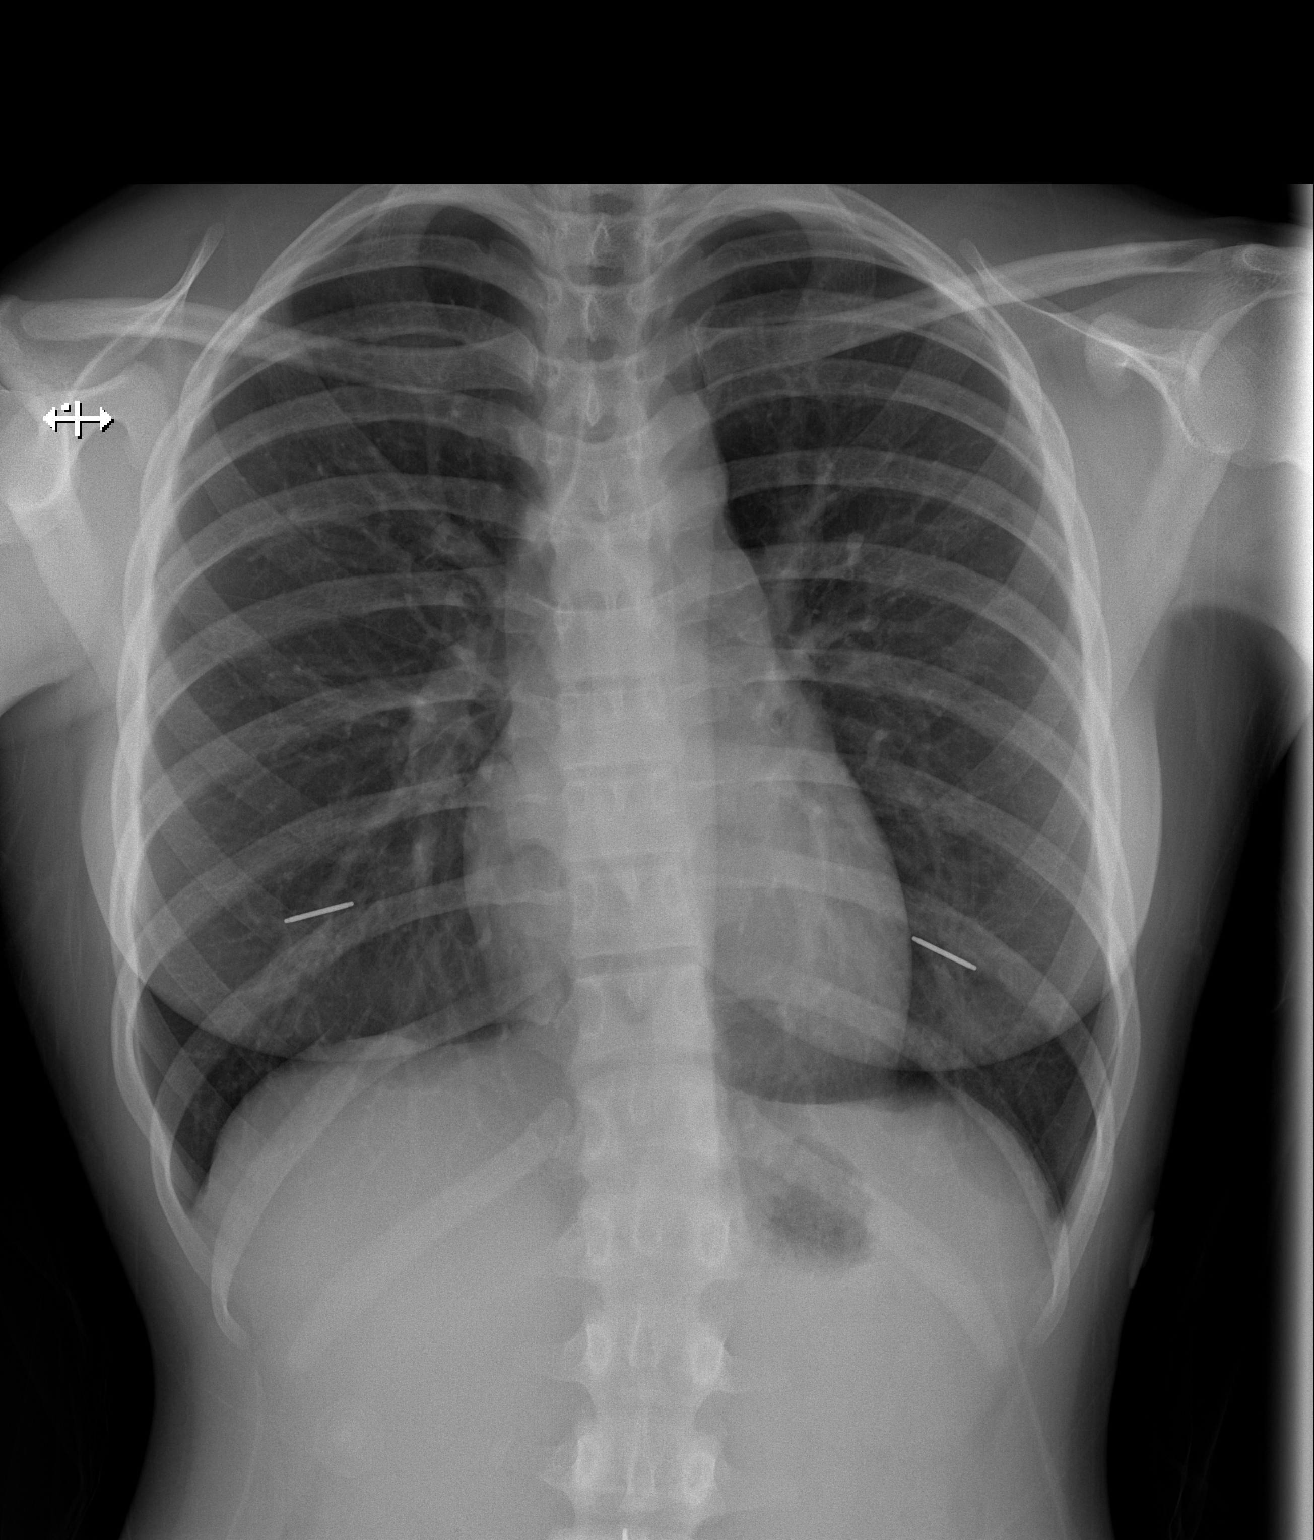

[w chest lat]
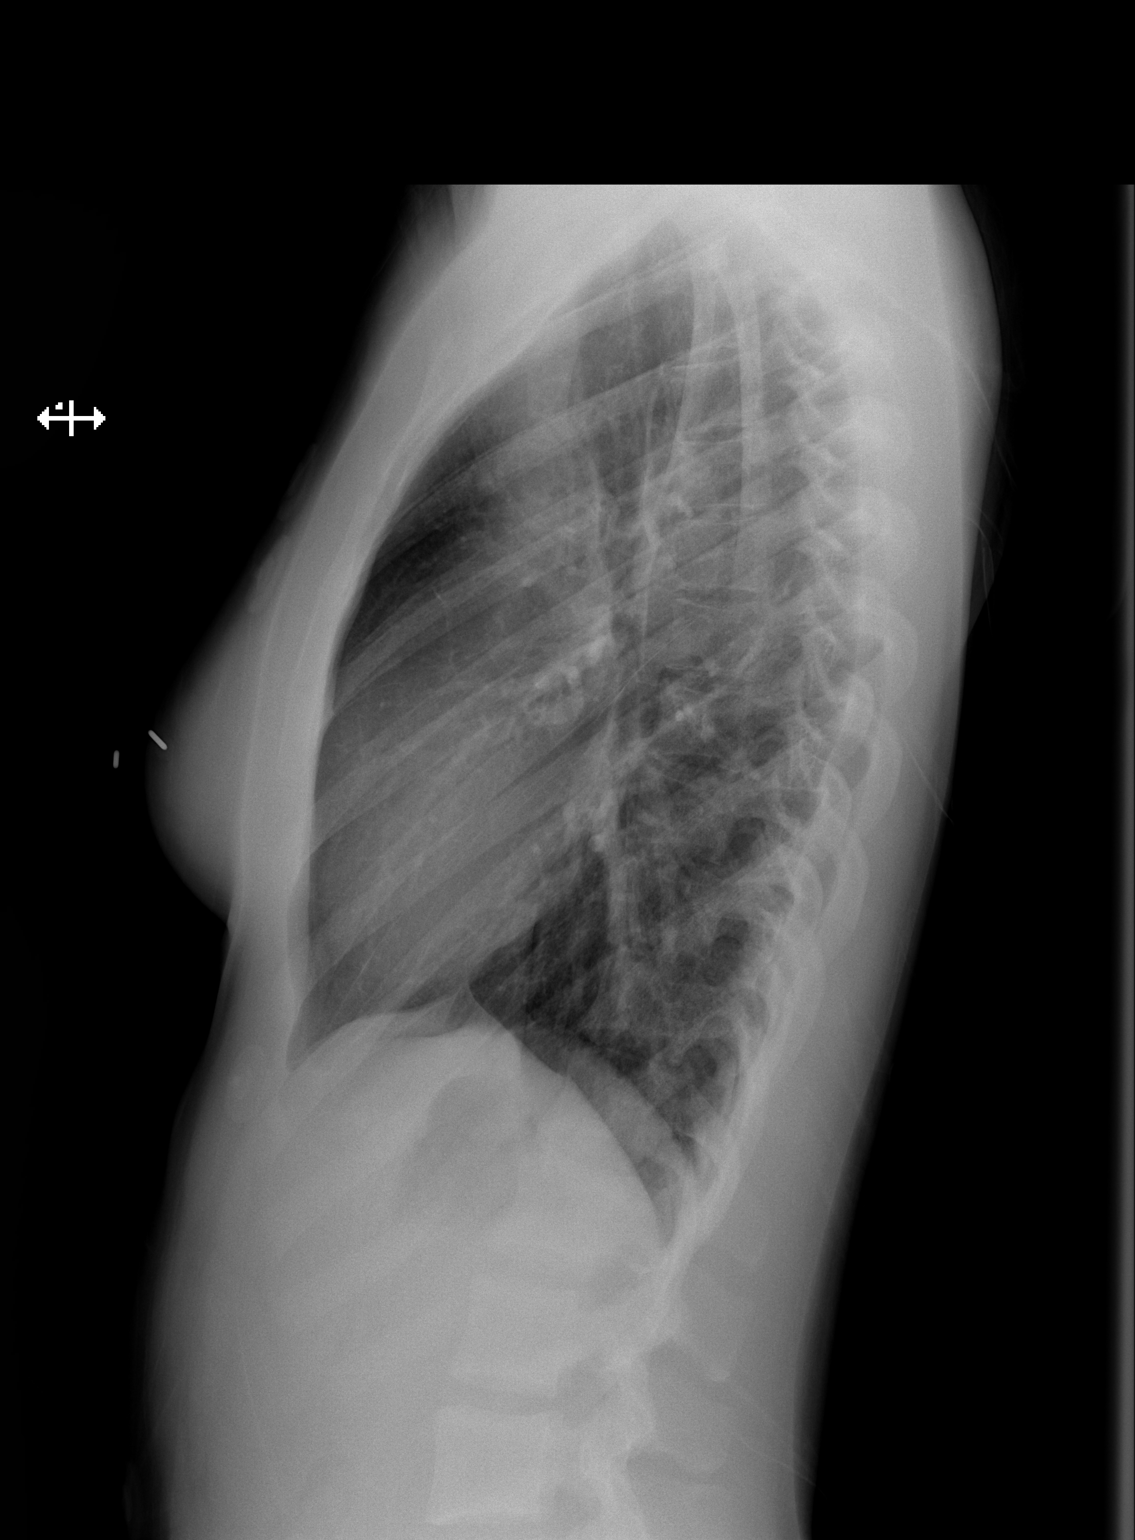

[2 of 2 positions shown; findings below may reference images not displayed]

FINDINGS: The heart size and mediastinal contours are within normal limits.
Both lungs are clear. The visualized skeletal structures are
unremarkable.
IMPRESSION: No active cardiopulmonary disease.
